# Patient Record
Sex: Female | Born: 1945 | Race: White | Hispanic: No | Marital: Single | Smoking: Never smoker
Health system: Southern US, Community
[De-identification: ages and names within clinical notes are randomized; demographics above are authoritative.]

---

## 2007-05-11 ENCOUNTER — Emergency Department: Payer: Self-pay | Admitting: Emergency Medicine

## 2008-06-08 ENCOUNTER — Ambulatory Visit: Payer: Self-pay | Admitting: Family Medicine

## 2010-12-24 ENCOUNTER — Ambulatory Visit: Payer: Self-pay | Admitting: Family Medicine

## 2011-03-07 ENCOUNTER — Ambulatory Visit: Payer: Self-pay | Admitting: Unknown Physician Specialty

## 2012-09-21 ENCOUNTER — Ambulatory Visit: Payer: Self-pay | Admitting: Family Medicine

## 2013-10-10 ENCOUNTER — Ambulatory Visit: Payer: Self-pay | Admitting: Family Medicine

## 2014-04-29 ENCOUNTER — Emergency Department: Payer: Self-pay | Admitting: Emergency Medicine

## 2018-01-15 ENCOUNTER — Ambulatory Visit
Admission: RE | Admit: 2018-01-15 | Discharge: 2018-01-15 | Disposition: A | Discharge status: Home / Self Care | Payer: Medicare Other | Source: Ambulatory Visit | Attending: Family Medicine | Admitting: Family Medicine

## 2018-01-15 ENCOUNTER — Other Ambulatory Visit: Payer: Self-pay | Admitting: Family Medicine

## 2018-01-15 DIAGNOSIS — Z1231 Encounter for screening mammogram for malignant neoplasm of breast: Secondary | ICD-10-CM | POA: Diagnosis not present

## 2018-01-19 ENCOUNTER — Other Ambulatory Visit: Payer: Self-pay | Admitting: Family Medicine

## 2018-01-19 DIAGNOSIS — R928 Other abnormal and inconclusive findings on diagnostic imaging of breast: Secondary | ICD-10-CM

## 2018-01-19 DIAGNOSIS — N6489 Other specified disorders of breast: Secondary | ICD-10-CM

## 2018-03-11 ENCOUNTER — Ambulatory Visit
Admission: RE | Admit: 2018-03-11 | Discharge: 2018-03-11 | Disposition: A | Discharge status: Home / Self Care | Payer: Medicare Other | Source: Ambulatory Visit | Attending: Family Medicine | Admitting: Family Medicine

## 2018-03-11 DIAGNOSIS — N6489 Other specified disorders of breast: Secondary | ICD-10-CM | POA: Diagnosis present

## 2018-03-11 DIAGNOSIS — R928 Other abnormal and inconclusive findings on diagnostic imaging of breast: Secondary | ICD-10-CM

## 2019-03-31 ENCOUNTER — Ambulatory Visit: Payer: Medicare Other

## 2019-04-11 ENCOUNTER — Ambulatory Visit: Payer: Medicare Other

## 2019-04-11 ENCOUNTER — Ambulatory Visit: Payer: Medicare PPO | Attending: Internal Medicine

## 2019-04-11 DIAGNOSIS — Z23 Encounter for immunization: Secondary | ICD-10-CM

## 2019-04-11 NOTE — Progress Notes (Signed)
   Covid-19 Vaccination Clinic  Name:  Floride Hutmacher    MRN: [id number] DOB: Apr 29, 1945  04/11/2019  Ms. Lesser was observed post Covid-19 immunization for 15 minutes without incidence. She was provided with Vaccine Information Sheet and instruction to access the V-Safe system.   Ms. Brierley was instructed to call 911 with any severe reactions post vaccine: Marland Kitchen Difficulty breathing  . Swelling of your face and throat  . A fast heartbeat  . A bad rash all over your body  . Dizziness and weakness    Immunizations Administered    Name Date Dose VIS Date Route   Pfizer COVID-19 Vaccine 04/11/2019 11:13 AM 0.3 mL 02/11/2019 Intramuscular   Manufacturer: [employer], [employer]   Lot: [id number]   NDC: [address]-3

## 2019-05-06 ENCOUNTER — Ambulatory Visit: Payer: Medicare PPO | Attending: Internal Medicine

## 2019-05-06 DIAGNOSIS — Z23 Encounter for immunization: Secondary | ICD-10-CM | POA: Insufficient documentation

## 2019-05-06 NOTE — Progress Notes (Signed)
   Covid-19 Vaccination Clinic  Name:  Eldene Plocher    MRN: [id number] DOB: 01-14-1946  05/06/2019  Ms. Aloisi was observed post Covid-19 immunization for 15 minutes without incident. She was provided with Vaccine Information Sheet and instruction to access the V-Safe system.   Ms. Weinmann was instructed to call 911 with any severe reactions post vaccine: Marland Kitchen Difficulty breathing  . Swelling of face and throat  . A fast heartbeat  . A bad rash all over body  . Dizziness and weakness   Immunizations Administered    Name Date Dose VIS Date Route   Pfizer COVID-19 Vaccine 05/06/2019  4:59 PM 0.3 mL 02/11/2019 Intramuscular   Manufacturer: [employer], [employer]   Lot: [id number]   NDC: [address]-5

## 2019-09-30 IMAGING — MG DIGITAL DIAGNOSTIC UNILATERAL RIGHT MAMMOGRAM WITH TOMO AND CAD
4 series · 4 of 12 positions shown · non-contrast
Comparison: Previous exam(s).

CLINICAL DATA: Recall from screening mammography with
tomosynthesis, possible asymmetry involving the OUTER RIGHT breast
visible only on the CC images.

Family history of breast cancer in her mother in her 80s.
EXAM:
DIGITAL DIAGNOSTIC UNILATERAL RIGHT MAMMOGRAM WITH CAD AND TOMO

[R CC synth-2D]
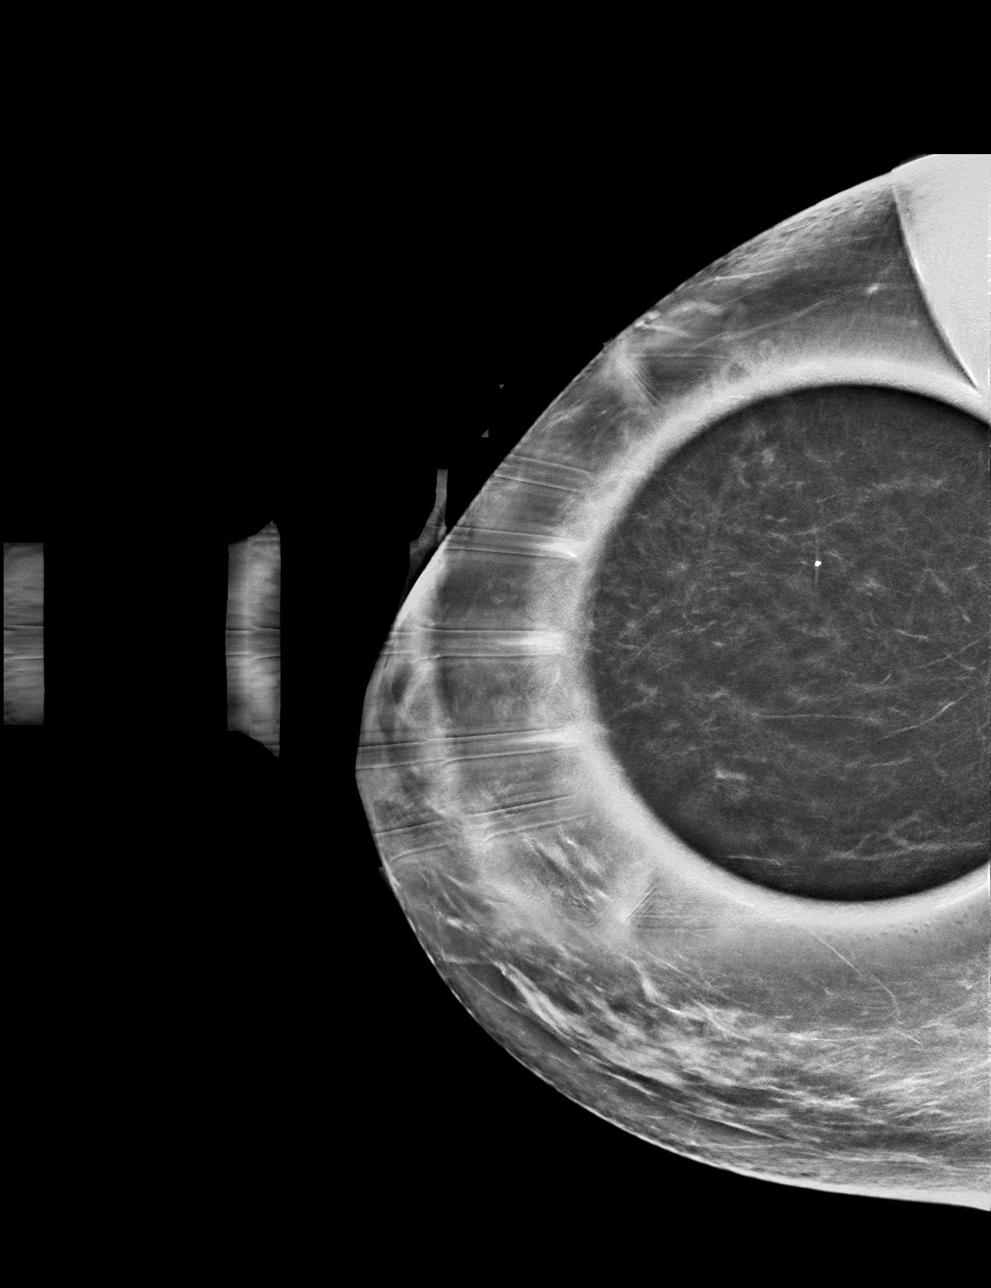

[R ML synth-2D]
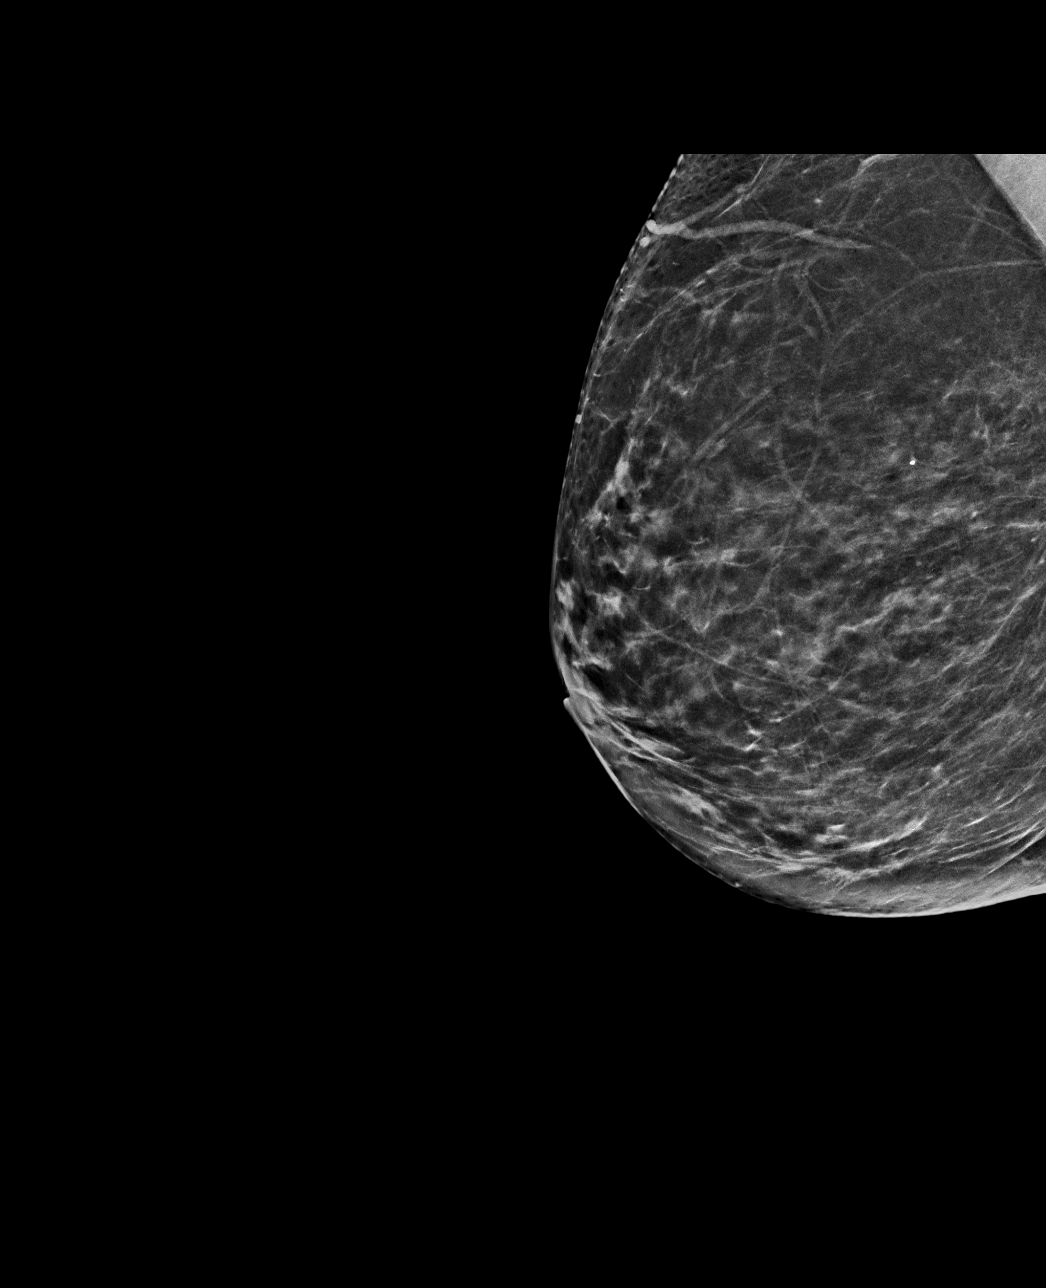

[R CC tomo · tomo slice 36/71.0]
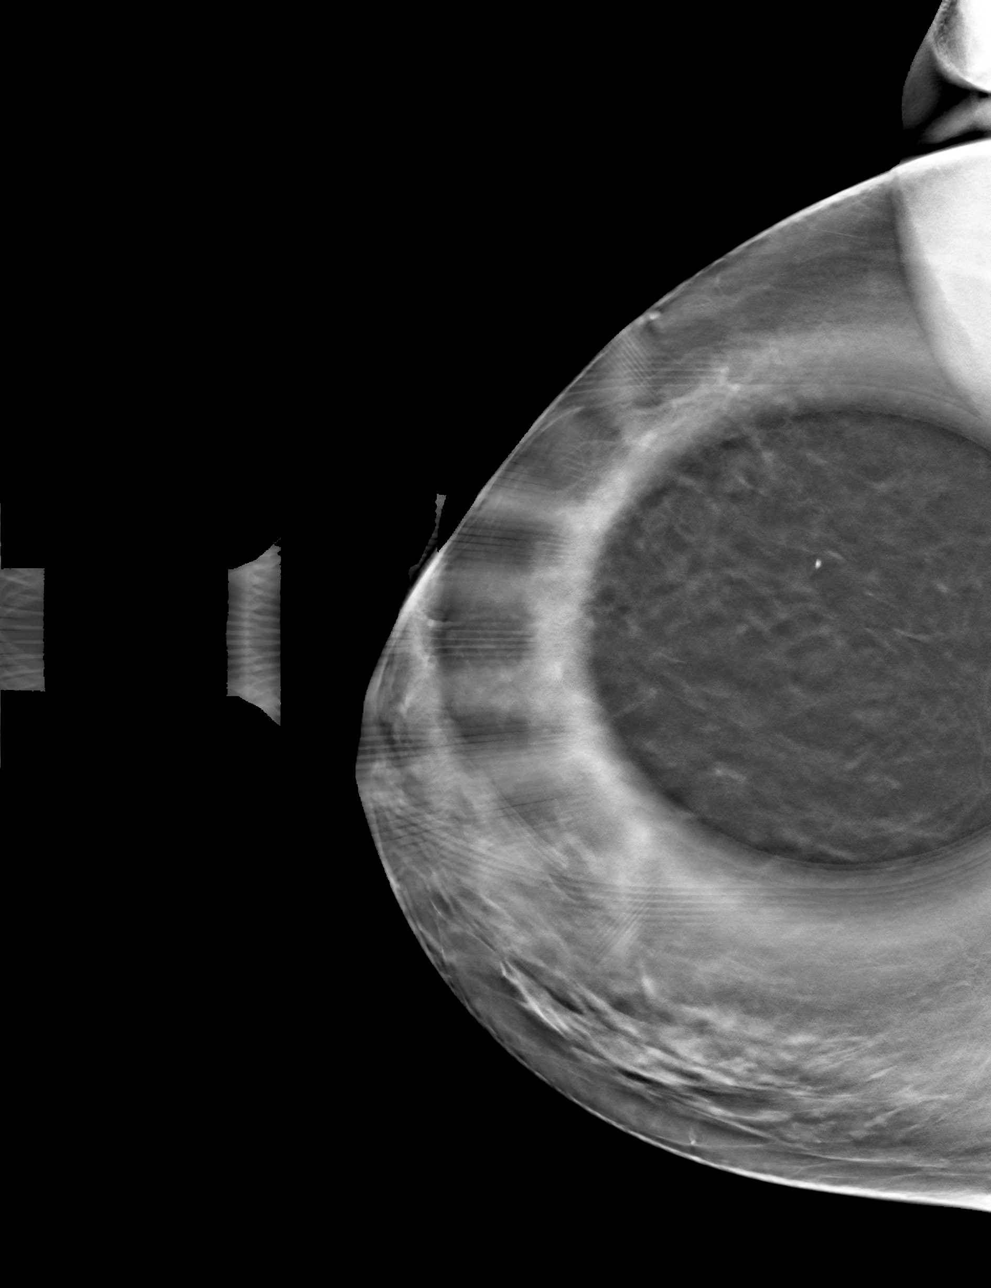

[R ML tomo · tomo slice 34/67.0]
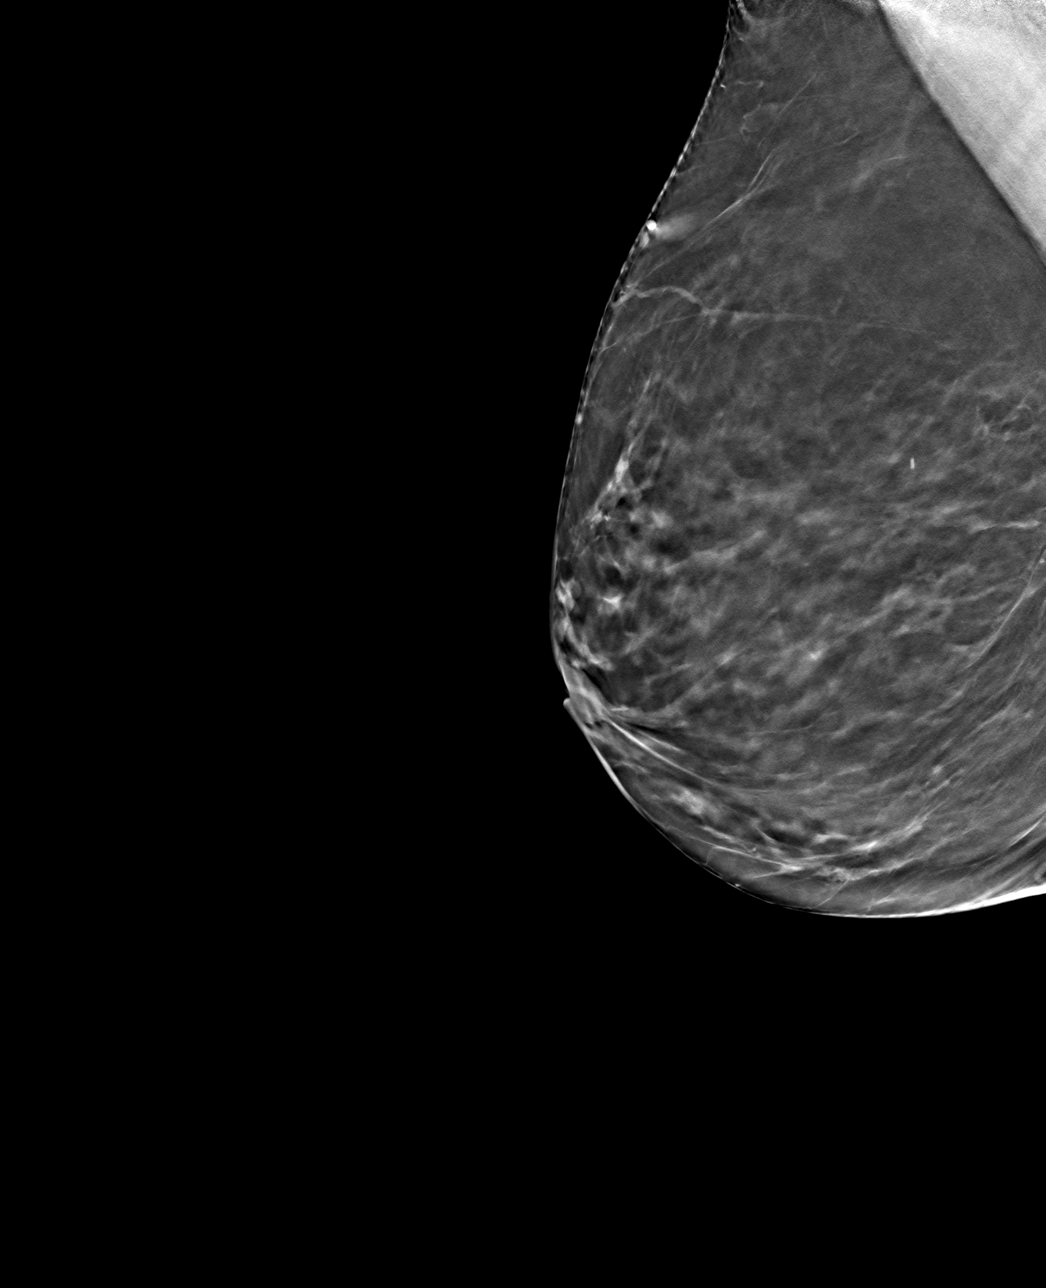

[4 of 12 positions shown; findings below may reference images not displayed]

ACR Breast Density Category b: There are scattered areas of
fibroglandular density.
FINDINGS: Tomosynthesis and synthesized spot compression CC view of the area
of concern in the RIGHT breast and tomosynthesis and synthesized
full field mediolateral view of the RIGHT breast were obtained.

The asymmetry questioned on screening mammography in the OUTER RIGHT
breast disperses with compression and there is no underlying mass or
architectural distortion. No findings suspicious for malignancy on
the full field mediolateral image.

The full field mediolateral image was processed with CAD.
IMPRESSION: 1. No mammographic evidence of malignancy involving the RIGHT
breast.
2. The asymmetry questioned on screening mammography corresponds to
overlapping normal fibroglandular tissue and Cooper's ligaments.

RECOMMENDATION:
Screening mammogram in one year.(Code:[id number])

I have discussed the findings and recommendations with the patient.
Results were also provided in writing at the conclusion of the
visit. If applicable, a reminder letter will be sent to the patient
regarding the next appointment.

BI-RADS CATEGORY  1: Negative.

## 2020-03-09 ENCOUNTER — Other Ambulatory Visit: Payer: Medicare PPO

## 2020-03-09 DIAGNOSIS — Z20822 Contact with and (suspected) exposure to covid-19: Secondary | ICD-10-CM

## 2020-03-13 LAB — NOVEL CORONAVIRUS, NAA: SARS-CoV-2, NAA: NOT DETECTED

## 2022-10-30 ENCOUNTER — Emergency Department: Payer: Medicare PPO

## 2022-10-30 ENCOUNTER — Other Ambulatory Visit: Payer: Self-pay

## 2022-10-30 DIAGNOSIS — N132 Hydronephrosis with renal and ureteral calculous obstruction: Secondary | ICD-10-CM | POA: Diagnosis not present

## 2022-10-30 DIAGNOSIS — R1032 Left lower quadrant pain: Secondary | ICD-10-CM | POA: Diagnosis present

## 2022-10-30 LAB — COMPREHENSIVE METABOLIC PANEL
ALT: 18 U/L (ref 0–44)
AST: 17 U/L (ref 15–41)
Albumin: 4.1 g/dL (ref 3.5–5.0)
Alkaline Phosphatase: 55 U/L (ref 38–126)
Anion gap: 10 (ref 5–15)
BUN: 22 mg/dL (ref 8–23)
CO2: 26 mmol/L (ref 22–32)
Calcium: 9 mg/dL (ref 8.9–10.3)
Chloride: 103 mmol/L (ref 98–111)
Creatinine, Ser: 1.16 mg/dL — ABNORMAL HIGH (ref 0.44–1.00)
GFR, Estimated: 49 mL/min — ABNORMAL LOW (ref >60)
Glucose, Bld: 147 mg/dL — ABNORMAL HIGH (ref 70–99)
Potassium: 3.6 mmol/L (ref 3.5–5.1)
Sodium: 139 mmol/L (ref 135–145)
Total Bilirubin: 0.7 mg/dL (ref 0.3–1.2)
Total Protein: 7.5 g/dL (ref 6.5–8.1)

## 2022-10-30 LAB — URINALYSIS, ROUTINE W REFLEX MICROSCOPIC
Bacteria, UA: NONE SEEN
Bilirubin Urine: NEGATIVE
Glucose, UA: NEGATIVE mg/dL
Ketones, ur: NEGATIVE mg/dL
Leukocytes,Ua: NEGATIVE
Nitrite: NEGATIVE
Protein, ur: 30 mg/dL — AB
Specific Gravity, Urine: 1.017 (ref 1.005–1.030)
pH: 7 (ref 5.0–8.0)

## 2022-10-30 LAB — CBC
HCT: 41.1 % (ref 36.0–46.0)
Hemoglobin: 13.4 g/dL (ref 12.0–15.0)
MCH: 29.5 pg (ref 26.0–34.0)
MCHC: 32.6 g/dL (ref 30.0–36.0)
MCV: 90.5 fL (ref 80.0–100.0)
Platelets: 189 10*3/uL (ref 150–400)
RBC: 4.54 MIL/uL (ref 3.87–5.11)
RDW: 12.8 % (ref 11.5–15.5)
WBC: 5.5 10*3/uL (ref 4.0–10.5)
nRBC: 0 % (ref 0.0–0.2)

## 2022-10-30 LAB — LIPASE, BLOOD: Lipase: 35 U/L (ref 11–51)

## 2022-10-30 NOTE — ED Triage Notes (Signed)
EMS brings pt in from home for c/o flank pain radiating into abd

## 2022-10-30 NOTE — ED Triage Notes (Signed)
Pt to ED via [facility] c/o left sided flank pain that radiates to abdomen. Started about an hr ago. Endorses some nausea, no vomiting/diarrhea. Denies fevers, chest pain, sob, dizziness.

## 2022-10-31 ENCOUNTER — Emergency Department
Admission: EM | Admit: 2022-10-31 | Discharge: 2022-10-31 | Disposition: A | Discharge status: Home / Self Care | Payer: Medicare PPO | Attending: Emergency Medicine | Admitting: Emergency Medicine

## 2022-10-31 DIAGNOSIS — N200 Calculus of kidney: Secondary | ICD-10-CM

## 2022-10-31 MED ORDER — ONDANSETRON HCL 4 MG/2ML IJ SOLN
4.0000 mg | Freq: Once | INTRAMUSCULAR | Status: AC
  Administered 2022-10-31: 4 mg via INTRAVENOUS
  Filled 2022-10-31: qty 2

## 2022-10-31 MED ORDER — ONDANSETRON 4 MG PO TBDP: 4.0000 mg | ORAL_TABLET | Freq: Four times a day (QID) | ORAL | 0 refills | Status: DC | PRN

## 2022-10-31 MED ORDER — KETOROLAC TROMETHAMINE 30 MG/ML IJ SOLN
15.0000 mg | Freq: Once | INTRAMUSCULAR | Status: AC
  Administered 2022-10-31: 15 mg via INTRAVENOUS
  Filled 2022-10-31: qty 1

## 2022-10-31 MED ORDER — TAMSULOSIN HCL 0.4 MG PO CAPS: 0.4000 mg | ORAL_CAPSULE | Freq: Every day | ORAL | 0 refills | Status: AC

## 2022-10-31 MED ORDER — HYDROCODONE-ACETAMINOPHEN 5-325 MG PO TABS
1.0000 | ORAL_TABLET | Freq: Four times a day (QID) | ORAL | 0 refills | Status: DC | PRN
Start: 2022-10-31 — End: 2022-11-06

## 2022-10-31 MED ORDER — FENTANYL CITRATE PF 50 MCG/ML IJ SOSY
50.0000 ug | PREFILLED_SYRINGE | Freq: Once | INTRAMUSCULAR | Status: AC
  Administered 2022-10-31: 50 ug via INTRAVENOUS
  Filled 2022-10-31: qty 1

## 2022-10-31 MED ORDER — SODIUM CHLORIDE 0.9 % IV BOLUS (SEPSIS)
500.0000 mL | Freq: Once | INTRAVENOUS | Status: AC
  Administered 2022-10-31: 500 mL via INTRAVENOUS

## 2022-10-31 NOTE — ED Provider Notes (Signed)
[facility] Provider Note    Event Date/Time   First MD Initiated Contact with Patient 10/31/22 0423     (approximate)   History   Flank Pain   HPI  Izabelle Hofmann is a 77 y.o. female with no significant past medical history who presents to the emergency department with left lower abdominal pain and flank pain that started tonight with nausea.  No vomiting, dysuria but feels like she is only able to urinate small amounts at a time.  No previous history of kidney stone.  No fever, diarrhea.   History provided by patient.    History reviewed. No pertinent past medical history.  History reviewed. No pertinent surgical history.  MEDICATIONS:  Prior to Admission medications   Not on File    Physical Exam   Triage Vital Signs: ED Triage Vitals  Encounter Vitals Group     BP 10/30/22 2208 (!) 141/88     Systolic BP Percentile --      Diastolic BP Percentile --      Pulse Rate 10/30/22 2208 87     Resp 10/30/22 2208 18     Temp 10/30/22 2208 98.5 F (36.9 C)     Temp Source 10/30/22 2208 Oral     SpO2 10/30/22 2149 100 %     Weight 10/30/22 2211 153 lb (69.4 kg)     Height 10/30/22 2211 5\' 8"  (1.727 m)     Head Circumference --      Peak Flow --      Pain Score 10/30/22 2209 1     Pain Loc --      Pain Education --      Exclude from Growth Chart --     Most recent vital signs: Vitals:   10/30/22 2208 10/31/22 0241  BP: (!) 141/88 (!) 164/83  Pulse: 87 70  Resp: 18 18  Temp: 98.5 F (36.9 C) 98.3 F (36.8 C)  SpO2: 96% 100%    CONSTITUTIONAL: Alert, responds appropriately to questions. Well-appearing; well-nourished HEAD: Normocephalic, atraumatic EYES: Conjunctivae clear, pupils appear equal, sclera nonicteric ENT: normal nose; moist mucous membranes NECK: Supple, normal ROM CARD: RRR; S1 and S2 appreciated RESP: Normal chest excursion without splinting or tachypnea; breath sounds clear and equal bilaterally; no wheezes,  no rhonchi, no rales, no hypoxia or respiratory distress, speaking full sentences ABD/GI: Non-distended; soft, tender in the left lower quadrant without guarding or rebound BACK: The back appears normal EXT: Normal ROM in all joints; no deformity noted, no edema SKIN: Normal color for age and race; warm; no rash on exposed skin NEURO: Moves all extremities equally, normal speech PSYCH: The patient's mood and manner are appropriate.   ED Results / Procedures / Treatments   LABS: (all labs ordered are listed, but only abnormal results are displayed) Labs Reviewed  COMPREHENSIVE METABOLIC PANEL - Abnormal; Notable for the following components:      Result Value   Glucose, Bld 147 (*)    Creatinine, Ser 1.16 (*)    GFR, Estimated 49 (*)    All other components within normal limits  URINALYSIS, ROUTINE W REFLEX MICROSCOPIC - Abnormal; Notable for the following components:   Color, Urine YELLOW (*)    APPearance HAZY (*)    Hgb urine dipstick MODERATE (*)    Protein, ur 30 (*)    All other components within normal limits  LIPASE, BLOOD  CBC     EKG:  My personal review and interpretation  of imaging: Patient has a 6 mm distal left ureteral stone.  I have personally reviewed all radiology reports.   CT Renal Stone Study  Result Date: 10/30/2022 CLINICAL DATA:  Left flank pain.  Concern for kidney stone. EXAM: CT ABDOMEN AND PELVIS WITHOUT CONTRAST TECHNIQUE: Multidetector CT imaging of the abdomen and pelvis was performed following the standard protocol without IV contrast. RADIATION DOSE REDUCTION: This exam was performed according to the departmental dose-optimization program which includes automated exposure control, adjustment of the mA and/or kV according to patient size and/or use of iterative reconstruction technique. COMPARISON:  None Available. FINDINGS: Evaluation of this exam is limited in the absence of intravenous contrast. Lower chest: Bibasilar linear  atelectasis/scarring. The visualized lung bases are otherwise clear. No intra-abdominal free air or free fluid. Hepatobiliary: The liver is unremarkable. No biliary ductal dilatation. The gallbladder is unremarkable. Pancreas: Unremarkable. No pancreatic ductal dilatation or surrounding inflammatory changes. Spleen: Normal in size without focal abnormality. Adrenals/Urinary Tract: The adrenal glands are unremarkable. There is a 6 mm distal left ureteral calculus with mild left hydronephrosis. The right kidney, right ureter appear unremarkable the urinary bladder is collapsed. Stomach/Bowel: There is severe sigmoid diverticulosis without active inflammatory changes. There is no bowel obstruction or active inflammation. The appendix is normal. Vascular/Lymphatic: Mild aortoiliac atherosclerotic disease. The IVC is unremarkable. No portal venous gas. There is no adenopathy. Reproductive: The uterus is anteverted and poorly visualized. No adnexal masses. Other: None Musculoskeletal: Osteopenia with degenerative changes. No acute osseous pathology. IMPRESSION: 1. A 6 mm distal left ureteral calculus with mild left hydronephrosis. 2. Severe sigmoid diverticulosis. No bowel obstruction. Normal appendix. 3.  Aortic Atherosclerosis (ICD10-I70.0). Electronically Signed   By: Elgie Collard M.D.   On: 10/30/2022 23:17     PROCEDURES:  Critical Care performed: No    Procedures    IMPRESSION / MDM / ASSESSMENT AND PLAN / ED COURSE  I reviewed the triage vital signs and the nursing notes.    Patient here with sudden onset abdominal and flank pain with nausea.    DIFFERENTIAL DIAGNOSIS (includes but not limited to):   Kidney stone, UTI, pyelonephritis, diverticulitis, colitis   Patient's presentation is most consistent with acute presentation with potential threat to life or bodily function.   PLAN: Workup initiated from triage.  No leukocytosis, slightly elevated creatinine at 1.16.  Normal LFTs and  lipase.  Urine shows blood but no sign of infection.  CT scan reviewed and interpreted by myself and the radiologist and shows 6 mm distal left ureteral calculus with mild left hydronephrosis.  She has diverticulosis without diverticulitis.  Appendix appears normal.  Will give IV fluids, pain and nausea medicine and reassess.   MEDICATIONS GIVEN IN ED: Medications  fentaNYL (SUBLIMAZE) injection 50 mcg (50 mcg Intravenous Given 10/31/22 0542)  ondansetron (ZOFRAN) injection 4 mg (4 mg Intravenous Given 10/31/22 0537)  ketorolac (TORADOL) 30 MG/ML injection 15 mg (15 mg Intravenous Given 10/31/22 0538)  sodium chloride 0.9 % bolus 500 mL (500 mLs Intravenous New Bag/Given 10/31/22 0536)     ED COURSE: Patient's pain has a improved.  Will discharge with pain and nausea medication as well as Flomax.  Given urology follow-up information.  Discussed return precautions and supportive care instructions.   CONSULTS:  none   OUTSIDE RECORDS REVIEWED: Reviewed last internal medicine visit note on 10/24/2022.       FINAL CLINICAL IMPRESSION(S) / ED DIAGNOSES   Final diagnoses:  Kidney stone     Rx /  DC Orders   ED Discharge Orders          Ordered    HYDROcodone-acetaminophen (NORCO/VICODIN) 5-325 MG tablet  Every 6 hours PRN        10/31/22 0619    tamsulosin (FLOMAX) 0.4 MG CAPS capsule  Daily        10/31/22 0619    ondansetron (ZOFRAN-ODT) 4 MG disintegrating tablet  Every 6 hours PRN        10/31/22 1027             Note:  This document was prepared using Dragon voice recognition software and may include unintentional dictation errors.   Jahziah Simonin, Layla Maw, DO 10/31/22 0630

## 2022-10-31 NOTE — Discharge Instructions (Addendum)

## 2022-11-05 ENCOUNTER — Ambulatory Visit
Admission: RE | Admit: 2022-11-05 | Discharge: 2022-11-05 | Disposition: A | Discharge status: Home / Self Care | Payer: Medicare PPO | Source: Ambulatory Visit | Attending: Urology | Admitting: Urology

## 2022-11-05 ENCOUNTER — Encounter: Payer: Self-pay | Admitting: Urology

## 2022-11-05 ENCOUNTER — Ambulatory Visit: Payer: Medicare PPO | Admitting: Urology

## 2022-11-05 ENCOUNTER — Ambulatory Visit
Admission: RE | Admit: 2022-11-05 | Discharge: 2022-11-05 | Disposition: A | Discharge status: Home / Self Care | Payer: Medicare PPO | Attending: Urology | Admitting: Urology

## 2022-11-05 VITALS — BP 122/76 | HR 80 | Ht 68.0 in | Wt 155.0 lb

## 2022-11-05 DIAGNOSIS — N200 Calculus of kidney: Secondary | ICD-10-CM | POA: Insufficient documentation

## 2022-11-05 DIAGNOSIS — N201 Calculus of ureter: Secondary | ICD-10-CM

## 2022-11-05 NOTE — Progress Notes (Signed)
I, Duke Salvia, acting as a Neurosurgeon for Riki Altes, MD.,have documented all relevant documentation on the behalf of Riki Altes, MD, as directed by  Riki Altes, MD while in the presence of Riki Altes, MD.   11/05/2022 7:17 PM   [name] 1945-12-20 [id number]  Referring provider: Marisue Ivan, MD [phone] [facility] [address] [facility] [place],  [place] [address]  Chief Complaint  Patient presents with   New Patient (Initial Visit)    HPI: Hannah Phelps is a 77 y.o. female presents for follow up of recent ED visit for renal colic.  Seen at [facility] ED 10/31/22 with acute onset of left floor quadrant abdominal pain associated  with urinary frequency, urgency, and voiding small amounts. +Nausea without vomiting. Mild dysuria. Urinalysis with significant microhematuria at 21-50 RBCs.   CT renal stone study showed a 6 mm left distal ureteral calculus with mild left hydronephrosis/hydroureter. She was discharged on hydrocodone, zofran, and tamsulosin. Her pain resolved and she did not get the hydrocodone and zofran filled. She picked up the Tamsulosin yesterday, but has not started yet. Today she has noted some increased frequency and urgency, but has no pain.   PMH: History reviewed. No pertinent past medical history.  Surgical History: History reviewed. No pertinent surgical history.  Home Medications:  Allergies as of 11/05/2022   Not on File      Medication List        Accurate as of November 05, 2022  7:17 PM. If you have any questions, ask your nurse or doctor.          HYDROcodone-acetaminophen 5-325 MG tablet Commonly known as: NORCO/VICODIN Take 1 tablet by mouth every 6 (six) hours as needed.   ondansetron 4 MG disintegrating tablet Commonly known as: ZOFRAN-ODT Take 1 tablet (4 mg total) by mouth every 6 (six) hours as needed for nausea or vomiting.   tamsulosin 0.4 MG Caps capsule Commonly known as: Flomax Take 1  capsule (0.4 mg total) by mouth daily.        Family History: Family History  Problem Relation Age of Onset   Breast cancer Mother        early 82's    Social History:  reports that she has never smoked. She has never used smokeless tobacco. No history on file for alcohol use and drug use.   Physical Exam: BP 122/76   Pulse 80   Ht 5\' 8"  (1.727 m)   Wt 155 lb (70.3 kg)   BMI 23.57 kg/m   Constitutional:  Alert and oriented, No acute distress. HEENT: [place] AT Respiratory: Normal respiratory effort, no increased work of breathing. GI: Abdomen is soft, nontender, nondistended, no abdominal masses Psychiatric: Normal mood and affect.   Assessment & Plan:     1. Left distal renal calculus We discussed various treatment options for urolithiasis including observation with or without medical expulsive therapy, shockwave lithotripsy (SWL), ureteroscopy and laser lithotripsy with stent placement. We discussed that management is based on stone size, location, density, patient co-morbidities, and patient preference.  Stones <7mm in size have a >80% spontaneous passage rate. Data surrounding the use of tamsulosin for medical expulsive therapy is controversial, but meta analyses suggests it is most efficacious for distal stones between 5-25mm in size. Possible side effects include dizziness/lightheadedness. SWL has a lower stone free rate in a single procedure, but also a lower complication rate compared to ureteroscopy and avoids a stent and associated stent  related symptoms. Possible complications include renal hematoma, steinstrasse, and need for additional treatment. Ureteroscopy with laser lithotripsy and stent placement has a higher stone free rate than SWL in a single procedure, however increased complication rate including possible infection, ureteral injury, bleeding, and stent related morbidity. Common stent related symptoms include dysuria, urgency/frequency, and flank pain. After an  extensive discussion of the risks and benefits of the above treatment options, the patient would like to proceed with medical expulsion therapy and will begin Tamsulosin today. KUB was ordered to see if the calculus is visualized on plain x-ray and she will be notified with the results.   I have reviewed the above documentation for accuracy and completeness, and I agree with the above.   Riki Altes, MD  [facility] [address], [address] [place], [place] [address] [phone]

## 2022-11-05 NOTE — H&P (View-Only) (Signed)
I, Duke Salvia, acting as a Neurosurgeon for Hannah Altes, MD.,have documented all relevant documentation on the behalf of Hannah Altes, MD, as directed by  Hannah Altes, MD while in the presence of Hannah Altes, MD.   11/05/2022 7:17 PM   [name] Jun 01, 1945 [id number]  Referring provider: Marisue Ivan, MD [phone] [facility] [address] [facility] [place],  [place] [address]  Chief Complaint  Patient presents with   New Patient (Initial Visit)    HPI: Hannah Phelps is a 77 y.o. female presents for follow up of recent ED visit for renal colic.  Seen at [facility] ED 10/31/22 with acute onset of left floor quadrant abdominal pain associated  with urinary frequency, urgency, and voiding small amounts. +Nausea without vomiting. Mild dysuria. Urinalysis with significant microhematuria at 21-50 RBCs.   CT renal stone study showed a 6 mm left distal ureteral calculus with mild left hydronephrosis/hydroureter. She was discharged on hydrocodone, zofran, and tamsulosin. Her pain resolved and she did not get the hydrocodone and zofran filled. She picked up the Tamsulosin yesterday, but has not started yet. Today she has noted some increased frequency and urgency, but has no pain.   PMH: History reviewed. No pertinent past medical history.  Surgical History: History reviewed. No pertinent surgical history.  Home Medications:  Allergies as of 11/05/2022   Not on File      Medication List        Accurate as of November 05, 2022  7:17 PM. If you have any questions, ask your nurse or doctor.          HYDROcodone-acetaminophen 5-325 MG tablet Commonly known as: NORCO/VICODIN Take 1 tablet by mouth every 6 (six) hours as needed.   ondansetron 4 MG disintegrating tablet Commonly known as: ZOFRAN-ODT Take 1 tablet (4 mg total) by mouth every 6 (six) hours as needed for nausea or vomiting.   tamsulosin 0.4 MG Caps capsule Commonly known as: Flomax Take 1  capsule (0.4 mg total) by mouth daily.        Family History: Family History  Problem Relation Age of Onset   Breast cancer Mother        early 25's    Social History:  reports that she has never smoked. She has never used smokeless tobacco. No history on file for alcohol use and drug use.   Physical Exam: BP 122/76   Pulse 80   Ht 5\' 8"  (1.727 m)   Wt 155 lb (70.3 kg)   BMI 23.57 kg/m   Constitutional:  Alert and oriented, No acute distress. HEENT: [place] AT Respiratory: Normal respiratory effort, no increased work of breathing. GI: Abdomen is soft, nontender, nondistended, no abdominal masses Psychiatric: Normal mood and affect.   Assessment & Plan:     1. Left distal renal calculus We discussed various treatment options for urolithiasis including observation with or without medical expulsive therapy, shockwave lithotripsy (SWL), ureteroscopy and laser lithotripsy with stent placement. We discussed that management is based on stone size, location, density, patient co-morbidities, and patient preference.  Stones <92mm in size have a >80% spontaneous passage rate. Data surrounding the use of tamsulosin for medical expulsive therapy is controversial, but meta analyses suggests it is most efficacious for distal stones between 5-2mm in size. Possible side effects include dizziness/lightheadedness. SWL has a lower stone free rate in a single procedure, but also a lower complication rate compared to ureteroscopy and avoids a stent and associated stent  related symptoms. Possible complications include renal hematoma, steinstrasse, and need for additional treatment. Ureteroscopy with laser lithotripsy and stent placement has a higher stone free rate than SWL in a single procedure, however increased complication rate including possible infection, ureteral injury, bleeding, and stent related morbidity. Common stent related symptoms include dysuria, urgency/frequency, and flank pain. After an  extensive discussion of the risks and benefits of the above treatment options, the patient would like to proceed with medical expulsion therapy and will begin Tamsulosin today. KUB was ordered to see if the calculus is visualized on plain x-ray and she will be notified with the results.   I have reviewed the above documentation for accuracy and completeness, and I agree with the above.   Hannah Altes, MD  [facility] [address] [place], [place] [address] [phone]

## 2022-11-06 ENCOUNTER — Encounter: Payer: Self-pay | Admitting: Urology

## 2022-11-06 ENCOUNTER — Telehealth: Payer: Self-pay | Admitting: Urology

## 2022-11-06 NOTE — Telephone Encounter (Signed)
Notified patient as instructed, patient states she will call us back to let us know what she would like to do.

## 2022-11-06 NOTE — Telephone Encounter (Signed)
Please let patient know her KUB reviewed and her stone is still present.  Recommend follow-up KUB 1-2 weeks unless she elects to schedule SWL.

## 2022-11-07 ENCOUNTER — Other Ambulatory Visit: Payer: Self-pay

## 2022-11-07 DIAGNOSIS — N201 Calculus of ureter: Secondary | ICD-10-CM

## 2022-11-07 NOTE — Progress Notes (Signed)
ESWL ORDER FORM  Expected date of procedure: 11/13/2022  Surgeon: Legrand Rams, MD  Post op standing: 2-4wk follow up w/KUB prior  Anticoagulation/Aspirin/NSAID standing order: Hold all 72 hours prior  Anesthesia standing order: MAC  VTE standing: SCD's  Dx: Left Ureteral Stone  Procedure: left Extracorporeal shock wave lithotripsy  CPT : 50590  Standing Order Set:   *NPO after mn, KUB  *NS 152ml/hr, Keflex 500mg  PO, Benadryl 25mg  PO, Valium 10mg  PO, Zofran 4mg  IV    Medications if other than standing orders:   NONE

## 2022-11-12 MED ORDER — DIAZEPAM 5 MG PO TABS
10.0000 mg | ORAL_TABLET | ORAL | Status: AC
  Administered 2022-11-13: 10 mg via ORAL

## 2022-11-12 MED ORDER — DIPHENHYDRAMINE HCL 25 MG PO CAPS
25.0000 mg | ORAL_CAPSULE | ORAL | Status: AC
  Administered 2022-11-13: 25 mg via ORAL

## 2022-11-12 MED ORDER — ONDANSETRON HCL 4 MG/2ML IJ SOLN
4.0000 mg | Freq: Once | INTRAMUSCULAR | Status: AC
  Administered 2022-11-13: 4 mg via INTRAVENOUS

## 2022-11-12 MED ORDER — SODIUM CHLORIDE 0.9 % IV SOLN: INTRAVENOUS | Status: DC

## 2022-11-12 MED ORDER — CEPHALEXIN 500 MG PO CAPS
500.0000 mg | ORAL_CAPSULE | Freq: Once | ORAL | Status: AC
  Administered 2022-11-13: 500 mg via ORAL

## 2022-11-13 ENCOUNTER — Ambulatory Visit
Admission: RE | Admit: 2022-11-13 | Discharge: 2022-11-13 | Disposition: A | Discharge status: Home / Self Care | Payer: Medicare PPO | Attending: Urology | Admitting: Urology

## 2022-11-13 ENCOUNTER — Other Ambulatory Visit: Payer: Self-pay

## 2022-11-13 ENCOUNTER — Encounter
Admission: RE | Disposition: A | Discharge status: Home / Self Care | Payer: Self-pay | Source: Home / Self Care | Attending: Urology

## 2022-11-13 ENCOUNTER — Encounter: Payer: Self-pay | Admitting: Urology

## 2022-11-13 ENCOUNTER — Ambulatory Visit: Payer: Medicare PPO

## 2022-11-13 DIAGNOSIS — N132 Hydronephrosis with renal and ureteral calculous obstruction: Secondary | ICD-10-CM | POA: Diagnosis present

## 2022-11-13 DIAGNOSIS — N201 Calculus of ureter: Secondary | ICD-10-CM

## 2022-11-13 HISTORY — PX: EXTRACORPOREAL SHOCK WAVE LITHOTRIPSY: SHX1557

## 2022-11-13 SURGERY — LITHOTRIPSY, ESWL
Anesthesia: Moderate Sedation | Laterality: Left

## 2022-11-13 MED ORDER — DIAZEPAM 5 MG PO TABS
ORAL_TABLET | ORAL | Status: AC
  Filled 2022-11-13: qty 2

## 2022-11-13 MED ORDER — CEPHALEXIN 500 MG PO CAPS
ORAL_CAPSULE | ORAL | Status: AC
  Filled 2022-11-13: qty 1

## 2022-11-13 MED ORDER — OXYCODONE-ACETAMINOPHEN 5-325 MG PO TABS: 1.0000 | ORAL_TABLET | Freq: Four times a day (QID) | ORAL | 0 refills | Status: AC | PRN

## 2022-11-13 MED ORDER — DIPHENHYDRAMINE HCL 25 MG PO CAPS
ORAL_CAPSULE | ORAL | Status: AC
  Filled 2022-11-13: qty 1

## 2022-11-13 MED ORDER — ONDANSETRON HCL 4 MG/2ML IJ SOLN
INTRAMUSCULAR | Status: AC
  Filled 2022-11-13: qty 2

## 2022-11-13 NOTE — Interval H&P Note (Signed)
UROLOGY H&P UPDATE  Agree with prior H&P dated 11/05/22 by Dr Lonna Cobb.  Cardiac: RRR Lungs: CTA bilaterally  Laterality: LEFT Procedure: Shockwave lithotripsy  Urine: UA with microheme but otherwise benign  Informed consent obtained, we specifically discussed the risks of bleeding/hematoma, infection, post-operative pain/obstructive fragments, need for additional procedures including URS.  Sondra Come, MD 11/13/2022

## 2022-11-13 NOTE — Discharge Instructions (Signed)

## 2022-11-13 NOTE — Brief Op Note (Signed)
11/13/2022  10:40 AM  PATIENT:  Hannah Phelps  77 y.o. female  PRE-OPERATIVE DIAGNOSIS:  7mm Left distal Ureteral Stone  POST-OPERATIVE DIAGNOSIS:  Same  PROCEDURE:  Procedure(s): EXTRACORPOREAL SHOCK WAVE LITHOTRIPSY (ESWL) (Left)  SURGEON:  Surgeons and Role:    * Sondra Come, MD - Primary  ANESTHESIA: Conscious Sedation  EBL:  None  Drains: None  Specimen: None  Findings:  Tolerated SWL very well, stone appeared to fragment   DISPO: Flomax, pain meds PRN, RTC 2 weeks KUB  Legrand Rams, MD 11/13/2022

## 2022-11-14 ENCOUNTER — Encounter: Payer: Self-pay | Admitting: Urology

## 2022-11-14 ENCOUNTER — Other Ambulatory Visit: Payer: Self-pay

## 2022-11-14 DIAGNOSIS — N201 Calculus of ureter: Secondary | ICD-10-CM

## 2022-12-02 NOTE — Progress Notes (Signed)
12/03/2022 8:46 PM   [name] 12/07/1945 [id number]  Referring provider: Marisue Ivan, MD [phone] [facility] [address] [facility] [place],  [place] [address]  Urological history: 1.  Nephrolithiasis -Left ESWL (11/13/2022)   Chief Complaint  Patient presents with   Follow-up   HPI: Hannah Phelps is a 77 y.o. female  who presents today for follow up.   Previous records reviewed.   She underwent ESWL for a left ureteral stone on November 13, 2022.  Her post residual course was as expected and uneventful.  Patient denies any modifying or aggravating factors.  Patient denies any recent UTI's, gross hematuria, dysuria or suprapubic/flank pain.  Patient denies any fevers, chills, nausea or vomiting.    UA negative for micro heme  KUB previous seen left distal ureteral stone is no longer visible on today's KUB  She did have some small fragments that she captured, but she forgot them at home  PMH: No past medical history on file.  Surgical History: Past Surgical History:  Procedure Laterality Date   EXTRACORPOREAL SHOCK WAVE LITHOTRIPSY Left 11/13/2022   Procedure: EXTRACORPOREAL SHOCK WAVE LITHOTRIPSY (ESWL);  Surgeon: Sondra Come, MD;  Location: [facility] ORS;  Service: Urology;  Laterality: Left;    Home Medications:  Allergies as of 12/03/2022       Reactions   Other Hives, Rash   poison oak        Medication List        Accurate as of December 03, 2022 11:59 PM. If you have any questions, ask your nurse or doctor.          levothyroxine 50 MCG tablet Commonly known as: SYNTHROID Take 50 mcg by mouth every morning.   tamsulosin 0.4 MG Caps capsule Commonly known as: Flomax Take 1 capsule (0.4 mg total) by mouth daily.        Allergies:  Allergies  Allergen Reactions   Other Hives and Rash    poison oak    Family History: Family History  Problem Relation Age of Onset   Breast cancer Mother        early 5's     Social History:  reports that she has never smoked. She has never used smokeless tobacco. No history on file for alcohol use and drug use.  ROS: Pertinent ROS in HPI  Physical Exam: BP 127/72   Pulse 77   Constitutional:  Well nourished. Alert and oriented, No acute distress. HEENT: [place] AT, moist mucus membranes.  Trachea midline Cardiovascular: No clubbing, cyanosis, or edema. Respiratory: Normal respiratory effort, no increased work of breathing. Neurologic: Grossly intact, no focal deficits, moving all 4 extremities. Psychiatric: Normal mood and affect.   Laboratory Data: Lab Results  Component Value Date   WBC 5.5 10/30/2022   HGB 13.4 10/30/2022   HCT 41.1 10/30/2022   MCV 90.5 10/30/2022   PLT 189 10/30/2022    Lab Results  Component Value Date   CREATININE 1.16 (H) 10/30/2022    Lab Results  Component Value Date   AST 17 10/30/2022   Lab Results  Component Value Date   ALT 18 10/30/2022    Urinalysis Component     Latest Ref Rng 12/03/2022  Glucose, UA     Negative  Negative   Specific Gravity, UA     1.005 - 1.030  1.010   pH, UA     5.0 - 7.5  5.5   Color, UA     Yellow  Yellow   Appearance Ur     Clear  Clear   Leukocytes,UA     Negative  Trace !   Protein,UA     Negative/Trace  Negative   Ketones, UA     Negative  Negative   RBC, UA     Negative  Negative   Bilirubin, UA     Negative  Negative   Urobilinogen, Ur     0.2 - 1.0 mg/dL 0.2   Nitrite, UA     Negative  Negative   Microscopic Examination See below:     Legend: ! Abnormal  Component     Latest Ref Rng 12/03/2022  WBC, UA     0 - 5 /hpf 6-10 !   Bacteria, UA     None seen/Few  Few   RBC, Urine     0 - 2 /hpf 0-2   Epithelial Cells (non renal)     0 - 10 /hpf 0-10   Mucus, UA     Not Estab.  Present !     Legend: ! Abnormal I have reviewed the labs.   Pertinent Imaging: KUB, see HPI.  Radiologist interpretation still pending I have independently reviewed  the films.    Assessment & Plan:    1. Left Ureteral stone -UA unremarkable -KUB treated stone no longer visualized -She will bring her fragments to the office tomorrow  2.  Microscopic hematuria -UA negative for micro heme, resolved with the passage of the stone  Return for pending stone analysis.  These notes generated with voice recognition software. I apologize for typographical errors.  Cloretta Ned  [facility] [address]  [address] [place], [place] [address] [phone]

## 2022-12-03 ENCOUNTER — Ambulatory Visit
Admission: RE | Admit: 2022-12-03 | Discharge: 2022-12-03 | Disposition: A | Discharge status: Home / Self Care | Payer: Medicare PPO | Source: Ambulatory Visit | Attending: Urology | Admitting: Urology

## 2022-12-03 ENCOUNTER — Ambulatory Visit: Payer: Medicare PPO | Admitting: Urology

## 2022-12-03 ENCOUNTER — Encounter: Payer: Self-pay | Admitting: Urology

## 2022-12-03 VITALS — BP 127/72 | HR 77

## 2022-12-03 DIAGNOSIS — Z09 Encounter for follow-up examination after completed treatment for conditions other than malignant neoplasm: Secondary | ICD-10-CM | POA: Diagnosis not present

## 2022-12-03 DIAGNOSIS — Z87442 Personal history of urinary calculi: Secondary | ICD-10-CM

## 2022-12-03 DIAGNOSIS — R3129 Other microscopic hematuria: Secondary | ICD-10-CM

## 2022-12-03 DIAGNOSIS — N201 Calculus of ureter: Secondary | ICD-10-CM | POA: Diagnosis present

## 2022-12-04 LAB — URINALYSIS, COMPLETE
Bilirubin, UA: NEGATIVE
Glucose, UA: NEGATIVE
Ketones, UA: NEGATIVE
Nitrite, UA: NEGATIVE
Protein,UA: NEGATIVE
RBC, UA: NEGATIVE
Specific Gravity, UA: 1.01 (ref 1.005–1.030)
Urobilinogen, Ur: 0.2 mg/dL (ref 0.2–1.0)
pH, UA: 5.5 (ref 5.0–7.5)

## 2022-12-04 LAB — MICROSCOPIC EXAMINATION

## 2022-12-05 ENCOUNTER — Other Ambulatory Visit: Payer: Self-pay | Admitting: *Deleted

## 2022-12-05 DIAGNOSIS — N201 Calculus of ureter: Secondary | ICD-10-CM

## 2022-12-16 LAB — CALCULI, WITH PHOTOGRAPH (CLINICAL LAB)
Calcium Oxalate Dihydrate: 30 %
Calcium Oxalate Monohydrate: 70 %
Weight Calculi: 4 mg
